# Patient Record
Sex: Female | Born: 1975 | Hispanic: Yes | Marital: Married | Smoking: Never smoker
Health system: Southern US, Community
[De-identification: ages and names within clinical notes are randomized; demographics above are authoritative.]

## PROBLEM LIST (undated history)

## (undated) DIAGNOSIS — I10 Essential (primary) hypertension: Secondary | ICD-10-CM

---

## 2000-08-21 ENCOUNTER — Other Ambulatory Visit: Admission: RE | Admit: 2000-08-21 | Discharge: 2000-08-21 | Payer: Self-pay | Admitting: *Deleted

## 2000-08-23 ENCOUNTER — Ambulatory Visit (HOSPITAL_COMMUNITY): Admission: RE | Admit: 2000-08-23 | Discharge: 2000-08-23 | Payer: Self-pay | Admitting: *Deleted

## 2000-08-23 ENCOUNTER — Encounter: Payer: Self-pay | Admitting: *Deleted

## 2001-03-04 ENCOUNTER — Inpatient Hospital Stay (HOSPITAL_COMMUNITY): Admission: AD | Admit: 2001-03-04 | Discharge: 2001-03-04 | Payer: Self-pay | Admitting: Obstetrics

## 2001-03-04 ENCOUNTER — Encounter: Payer: Self-pay | Admitting: Obstetrics

## 2001-03-27 ENCOUNTER — Inpatient Hospital Stay (HOSPITAL_COMMUNITY): Admission: AD | Admit: 2001-03-27 | Discharge: 2001-03-29 | Payer: Self-pay | Admitting: Obstetrics

## 2002-05-21 ENCOUNTER — Other Ambulatory Visit: Admission: RE | Admit: 2002-05-21 | Discharge: 2002-05-21 | Payer: Self-pay | Admitting: *Deleted

## 2004-02-29 ENCOUNTER — Other Ambulatory Visit: Admission: RE | Admit: 2004-02-29 | Discharge: 2004-02-29 | Payer: Self-pay | Admitting: *Deleted

## 2005-12-26 ENCOUNTER — Encounter: Admission: RE | Admit: 2005-12-26 | Discharge: 2005-12-26 | Payer: Self-pay | Admitting: *Deleted

## 2012-02-06 ENCOUNTER — Other Ambulatory Visit: Payer: Self-pay | Admitting: Physician Assistant

## 2012-02-12 ENCOUNTER — Other Ambulatory Visit: Payer: Self-pay | Admitting: Physician Assistant

## 2012-05-03 ENCOUNTER — Other Ambulatory Visit: Payer: Self-pay | Admitting: Physician Assistant

## 2012-08-11 ENCOUNTER — Other Ambulatory Visit: Payer: Self-pay | Admitting: Physician Assistant

## 2012-08-12 NOTE — Telephone Encounter (Signed)
Patients chart is at the nurses station in the pa pool pile.  [facility] [id number]

## 2014-08-01 ENCOUNTER — Emergency Department (HOSPITAL_COMMUNITY): Payer: Self-pay

## 2014-08-01 ENCOUNTER — Emergency Department (HOSPITAL_COMMUNITY)
Admission: EM | Admit: 2014-08-01 | Discharge: 2014-08-01 | Disposition: A | Discharge status: Home / Self Care | Payer: Self-pay | Attending: Emergency Medicine | Admitting: Emergency Medicine

## 2014-08-01 ENCOUNTER — Encounter (HOSPITAL_COMMUNITY): Payer: Self-pay | Admitting: Emergency Medicine

## 2014-08-01 DIAGNOSIS — K2970 Gastritis, unspecified, without bleeding: Secondary | ICD-10-CM | POA: Insufficient documentation

## 2014-08-01 DIAGNOSIS — R0789 Other chest pain: Secondary | ICD-10-CM | POA: Insufficient documentation

## 2014-08-01 DIAGNOSIS — Z79899 Other long term (current) drug therapy: Secondary | ICD-10-CM | POA: Insufficient documentation

## 2014-08-01 DIAGNOSIS — I10 Essential (primary) hypertension: Secondary | ICD-10-CM | POA: Insufficient documentation

## 2014-08-01 DIAGNOSIS — Z7982 Long term (current) use of aspirin: Secondary | ICD-10-CM | POA: Insufficient documentation

## 2014-08-01 DIAGNOSIS — F419 Anxiety disorder, unspecified: Secondary | ICD-10-CM | POA: Insufficient documentation

## 2014-08-01 DIAGNOSIS — R0602 Shortness of breath: Secondary | ICD-10-CM | POA: Insufficient documentation

## 2014-08-01 HISTORY — DX: Essential (primary) hypertension: I10

## 2014-08-01 LAB — CBC WITH DIFFERENTIAL/PLATELET
Basophils Absolute: 0 10*3/uL (ref 0.0–0.1)
Basophils Relative: 0 % (ref 0–1)
EOS ABS: 0.1 10*3/uL (ref 0.0–0.7)
EOS PCT: 1 % (ref 0–5)
HCT: 37.5 % (ref 36.0–46.0)
HEMOGLOBIN: 13.1 g/dL (ref 12.0–15.0)
LYMPHS ABS: 3 10*3/uL (ref 0.7–4.0)
Lymphocytes Relative: 27 % (ref 12–46)
MCH: 30.8 pg (ref 26.0–34.0)
MCHC: 34.9 g/dL (ref 30.0–36.0)
MCV: 88 fL (ref 78.0–100.0)
MONOS PCT: 6 % (ref 3–12)
Monocytes Absolute: 0.7 10*3/uL (ref 0.1–1.0)
Neutro Abs: 7.5 10*3/uL (ref 1.7–7.7)
Neutrophils Relative %: 66 % (ref 43–77)
Platelets: 295 10*3/uL (ref 150–400)
RBC: 4.26 MIL/uL (ref 3.87–5.11)
RDW: 12.8 % (ref 11.5–15.5)
WBC: 11.3 10*3/uL — ABNORMAL HIGH (ref 4.0–10.5)

## 2014-08-01 LAB — I-STAT CHEM 8, ED
BUN: 7 mg/dL (ref 6–23)
CREATININE: 0.6 mg/dL (ref 0.50–1.10)
Calcium, Ion: 1.21 mmol/L (ref 1.12–1.23)
Chloride: 107 mEq/L (ref 96–112)
GLUCOSE: 83 mg/dL (ref 70–99)
HCT: 41 % (ref 36.0–46.0)
HEMOGLOBIN: 13.9 g/dL (ref 12.0–15.0)
Potassium: 3.7 mEq/L (ref 3.7–5.3)
Sodium: 141 mEq/L (ref 137–147)
TCO2: 21 mmol/L (ref 0–100)

## 2014-08-01 LAB — I-STAT TROPONIN, ED: Troponin i, poc: 0 ng/mL (ref 0.00–0.08)

## 2014-08-01 MED ORDER — RANITIDINE HCL 150 MG PO TABS: 150.0000 mg | ORAL_TABLET | Freq: Two times a day (BID) | ORAL | Status: AC

## 2014-08-01 NOTE — ED Notes (Signed)
Returned from xray

## 2014-08-01 NOTE — Discharge Instructions (Signed)

## 2014-08-01 NOTE — ED Provider Notes (Signed)
CSN: [id number]     Arrival date & time 08/01/14  1111 History   First MD Initiated Contact with Patient 08/01/14 1206     Chief Complaint  Patient presents with  . Chest Pain     (Consider location/radiation/quality/duration/timing/severity/associated sxs/prior Treatment) Patient is a 38 y.o. female presenting with chest pain. The history is provided by the patient and a relative. The history is limited by a language barrier. A language interpreter was used.  Chest Pain Pain location:  Substernal area and epigastric Pain quality: burning   Pain quality comment:  Squeezing Pain radiates to:  Does not radiate Pain radiates to the back: no   Pain severity:  Moderate Onset quality:  Gradual Duration:  1 hour Timing:  Constant Progression:  Resolved Chronicity:  New Context: at rest and stress   Context comment:  Was sitting on the cough when she began feeling SOB and then started to have squeezing in her chest Relieved by:  Aspirin and nitroglycerin Worsened by:  Nothing tried Ineffective treatments:  None tried Associated symptoms: abdominal pain and shortness of breath   Associated symptoms: no cough, no diaphoresis, no lower extremity edema, no nausea, not vomiting and no weakness   Associated symptoms comment:  For months now her stomach has felt like it was in knots due to increased stress with her family Risk factors: birth control and hypertension   Risk factors: no coronary artery disease, no diabetes mellitus, no immobilization, not obese, not pregnant, no prior DVT/PE, no smoking and no surgery     Past Medical History  Diagnosis Date  . Hypertension    History reviewed. No pertinent past surgical history. History reviewed. No pertinent family history. History  Substance Use Topics  . Smoking status: Never Smoker   . Smokeless tobacco: Never Used  . Alcohol Use: No   OB History   Grav Para Term Preterm Abortions TAB SAB Ect Mult Living                 Review  of Systems  Constitutional: Negative for diaphoresis.  Respiratory: Positive for shortness of breath. Negative for cough.   Cardiovascular: Positive for chest pain.  Gastrointestinal: Positive for abdominal pain. Negative for nausea and vomiting.  Neurological: Negative for weakness.  All other systems reviewed and are negative.     Allergies  Review of patient's allergies indicates no known allergies.  Home Medications   Prior to Admission medications   Medication Sig Start Date End Date Taking? Authorizing Provider  aspirin 325 MG tablet Take 325 mg by mouth once.   Yes Historical Provider, MD  lisinopril-hydrochlorothiazide (PRINZIDE,ZESTORETIC) 10-12.5 MG per tablet Take 1 tablet by mouth daily.   Yes Historical Provider, MD   BP 109/66  Pulse 80  Temp(Src) 98.3 F (36.8 C) (Oral)  Resp 14  SpO2 99%  LMP 07/11/2014 Physical Exam  Nursing note and vitals reviewed. Constitutional: She is oriented to person, place, and time. She appears well-developed and well-nourished. No distress.  HENT:  Head: Normocephalic and atraumatic.  Mouth/Throat: Oropharynx is clear and moist.  Eyes: Conjunctivae and EOM are normal. Pupils are equal, round, and reactive to light.  Neck: Normal range of motion. Neck supple.  Cardiovascular: Normal rate, regular rhythm and intact distal pulses.   No murmur heard. Pulmonary/Chest: Effort normal and breath sounds normal. No respiratory distress. She has no wheezes. She has no rales. She exhibits no tenderness.  Abdominal: Soft. She exhibits no distension. There is no tenderness. There  is no rebound and no guarding.  Musculoskeletal: Normal range of motion. She exhibits no edema and no tenderness.  Neurological: She is alert and oriented to person, place, and time.  Skin: Skin is warm and dry. No rash noted. No erythema.  Psychiatric: She has a normal mood and affect. Her behavior is normal.    ED Course  Procedures (including critical care  time) Labs Review Labs Reviewed  CBC WITH DIFFERENTIAL - Abnormal; Notable for the following:    WBC 11.3 (*)    All other components within normal limits  I-STAT CHEM 8, ED  I-STAT TROPOININ, ED    Imaging Review Dg Chest 2 View  08/01/2014   CLINICAL DATA:  chestpain  EXAM: CHEST - 2 VIEW  COMPARISON:  None available  FINDINGS: Lungs are clear. Heart size and mediastinal contours are within normal limits. No effusion. Visualized skeletal structures are unremarkable.  IMPRESSION: No acute cardiopulmonary disease.   Electronically Signed   By: Oley Balm M.D.   On: 08/01/2014 15:03     EKG Interpretation   Date/Time:  Saturday August 01 2014 11:18:36 EDT Ventricular Rate:  81 PR Interval:  148 QRS Duration: 114 QT Interval:  377 QTC Calculation: 438 R Axis:   -3 Text Interpretation:  Sinus rhythm Incomplete right bundle branch block  Low voltage, precordial leads No previous tracing Confirmed by Anitra Lauth   MD, Alphonzo Lemmings ([id number]) on 08/01/2014 12:18:55 PM      MDM   Final diagnoses:  Atypical chest pain  Gastritis    Patient presenting with atypical chest pain that started while she was sitting on her couch. It was a squeezing pain that started after a sensation of feeling short of breath. Patient took an aspirin with some pain relief and EMS gave her one nitroglycerin with resolution of pain. Patient's only cardiac risk factor is hypertension. She denies any prior history of chest pain. However she states in the last few months she's had increased stress and anxiousness.  She has ongoing stomach issues where she feels like her stomach is in knots and is constantly nervous and upset. She denies taking any over-the-counter stomach medication and takes nothing current depression or anxiety at this time. EKG shows an incomplete right bundle branch block but is otherwise within normal limits with none to compare. Patient denies any chest pain at this time however does have some  mild epigastric pain on exam. She is low risk for PE without symptoms suggestive of PE. Patient also states she was diagnosed with a urinary tract infection yesterday by her PCP and was given an antibiotic but has not started it yet. She currently denies any urinary symptoms and her last menstrual period was 3 weeks ago.  Low Suspicion for cardiac chest pain at this time. EKG without acute findings. Heart Score of 1. Chest x-ray, CBC, BMP, i-STAT troponin pending. By the time patient arrived here from the onset of her pain it has been every 3 hours. The initial troponin is negative given her low risk feel that one troponin will be sufficient. Feel most likely patient's symptoms are related to GI upset and stress and anxiety.  2:00 PM All labs wnl.  CXR pending. 9:07 AM Normal CXR.  Chest pain has not returned and pt feels much better.  Feel GI and anxiety related started PPI and d/ced home.  Gwyneth Sprout, MD 08/02/14 [id number]

## 2014-08-01 NOTE — ED Notes (Signed)
[facility] presents with a 38 yo female from home with chest pain that occurred approximately at 10 am this morning while watching TV.  Pt had similar episode approximately 2 weeks ago; however, pt took ASA and relieved pain and did not seek medical attention.  Pt had left CP rated 5 out of 10 and squeezing.  Took 325 ASA and relieved pain some; [facility] gave 1 NTG and pain relieved.  Hx of HTN and took BP meds (lisinopril) at 8 am this morning.  NSR. VS stable. No N/V and no diaphoresis.

## 2015-12-14 ENCOUNTER — Emergency Department (HOSPITAL_COMMUNITY)
Admission: EM | Admit: 2015-12-14 | Discharge: 2015-12-14 | Disposition: A | Discharge status: Home / Self Care | Payer: BLUE CROSS/BLUE SHIELD | Attending: Emergency Medicine | Admitting: Emergency Medicine

## 2015-12-14 ENCOUNTER — Emergency Department (HOSPITAL_COMMUNITY): Payer: BLUE CROSS/BLUE SHIELD

## 2015-12-14 ENCOUNTER — Encounter (HOSPITAL_COMMUNITY): Payer: Self-pay

## 2015-12-14 DIAGNOSIS — Z23 Encounter for immunization: Secondary | ICD-10-CM

## 2015-12-14 DIAGNOSIS — R21 Rash and other nonspecific skin eruption: Secondary | ICD-10-CM | POA: Diagnosis present

## 2015-12-14 DIAGNOSIS — Z203 Contact with and (suspected) exposure to rabies: Secondary | ICD-10-CM | POA: Diagnosis not present

## 2015-12-14 DIAGNOSIS — Z7982 Long term (current) use of aspirin: Secondary | ICD-10-CM | POA: Insufficient documentation

## 2015-12-14 DIAGNOSIS — I10 Essential (primary) hypertension: Secondary | ICD-10-CM | POA: Insufficient documentation

## 2015-12-14 DIAGNOSIS — Z3202 Encounter for pregnancy test, result negative: Secondary | ICD-10-CM | POA: Insufficient documentation

## 2015-12-14 DIAGNOSIS — Z79899 Other long term (current) drug therapy: Secondary | ICD-10-CM | POA: Diagnosis not present

## 2015-12-14 LAB — COMPREHENSIVE METABOLIC PANEL
ALBUMIN: 3.5 g/dL (ref 3.5–5.0)
ALT: 32 U/L (ref 14–54)
AST: 32 U/L (ref 15–41)
Alkaline Phosphatase: 83 U/L (ref 38–126)
Anion gap: 12 (ref 5–15)
BILIRUBIN TOTAL: 0.2 mg/dL — AB (ref 0.3–1.2)
BUN: 7 mg/dL (ref 6–20)
CALCIUM: 9.5 mg/dL (ref 8.9–10.3)
CO2: 22 mmol/L (ref 22–32)
Chloride: 106 mmol/L (ref 101–111)
Creatinine, Ser: 0.59 mg/dL (ref 0.44–1.00)
GFR calc Af Amer: >60 mL/min (ref >60)
GFR calc non Af Amer: >60 mL/min (ref >60)
GLUCOSE: 118 mg/dL — AB (ref 65–99)
Potassium: 3.7 mmol/L (ref 3.5–5.1)
Sodium: 140 mmol/L (ref 135–145)
TOTAL PROTEIN: 7.2 g/dL (ref 6.5–8.1)

## 2015-12-14 LAB — CBC WITH DIFFERENTIAL/PLATELET
BASOS ABS: 0 10*3/uL (ref 0.0–0.1)
BASOS PCT: 0 %
Eosinophils Absolute: 0.2 10*3/uL (ref 0.0–0.7)
Eosinophils Relative: 2 %
HCT: 39.6 % (ref 36.0–46.0)
Hemoglobin: 13.5 g/dL (ref 12.0–15.0)
LYMPHS PCT: 36 %
Lymphs Abs: 3.4 10*3/uL (ref 0.7–4.0)
MCH: 30.9 pg (ref 26.0–34.0)
MCHC: 34.1 g/dL (ref 30.0–36.0)
MCV: 90.6 fL (ref 78.0–100.0)
Monocytes Absolute: 0.7 10*3/uL (ref 0.1–1.0)
Monocytes Relative: 7 %
NEUTROS PCT: 55 %
Neutro Abs: 5.3 10*3/uL (ref 1.7–7.7)
Platelets: 262 10*3/uL (ref 150–400)
RBC: 4.37 MIL/uL (ref 3.87–5.11)
RDW: 12.5 % (ref 11.5–15.5)
WBC: 9.6 10*3/uL (ref 4.0–10.5)

## 2015-12-14 LAB — I-STAT BETA HCG BLOOD, ED (MC, WL, AP ONLY): I-stat hCG, quantitative: <5 m[IU]/mL (ref <5)

## 2015-12-14 MED ORDER — RABIES IMMUNE GLOBULIN 150 UNIT/ML IM INJ
20.0000 [IU]/kg | INJECTION | Freq: Once | INTRAMUSCULAR | Status: AC
  Administered 2015-12-14: 1725 [IU] via INTRAMUSCULAR
  Filled 2015-12-14: qty 11.5

## 2015-12-14 MED ORDER — RABIES VACCINE, PCEC IM SUSR
1.0000 mL | Freq: Once | INTRAMUSCULAR | Status: AC
  Administered 2015-12-14: 1 mL via INTRAMUSCULAR
  Filled 2015-12-14: qty 1

## 2015-12-14 NOTE — Discharge Instructions (Signed)
°  Por favor, siga con su mdico de atencin primaria en los prximos 2 das para un chequeo. Deben obtener registros para su posterior manejo.  No dude en volver al [facility] para cualquier nuevo, empeoramiento o sntomas relacionados.   Usted necesitar inyecciones de refuerzo para asegurarse de que est protegido contra la rabia. El calendario para las inyecciones de refuerzo es el siguiente:  [place] 3: Franco Nones 12/17/2015 Para: Shela Nevin  DA 7: Fecha 12/21/2015 Para: [facility]  DIA 14: Fecha 2/28/207 A: Shela Nevin

## 2015-12-14 NOTE — ED Notes (Signed)
Pt here for c/o rash after visiting [place] last week. She reports being in a cave and PCP sent her here to be evaluated. Redness noted to chest, back, and arms. No distress.

## 2015-12-14 NOTE — ED Provider Notes (Signed)
CSN: [id number]     Arrival date & time 12/14/15  1718 History  By signing my name below, I, Melanie Salinas, attest that this documentation has been prepared under the direction and in the presence of [employer], PA-C. Electronically Signed: Randell Patient, ED Scribe. 12/14/2015. 8:36 PM.     Chief Complaint  Patient presents with  . Rash   The history is provided by the patient. No language interpreter was used.   HPI Comments: Melanie Salinas is a 40 y.o. female with an hx of HTN who presents to the Emergency Department complaining of constant, pruritic, burning rash that appeared on her upper chest 6 days ago upon waking and has since spread to her back and BUE. Son reports that the patient was exiting a cave with bats in [place], [place] 1 week ago when she felt something fly around her and touch her that she immediately attempted to swat away. She endorses cough and fatigue. She has taken Zantac without relief. Per patient, she was seen by her PCP and prescribed a hydrocortisone cream which she has not applied. Patient denies being vaccinated for rabies. She denies nausea and vomiting.  Past Medical History  Diagnosis Date  . Hypertension    History reviewed. No pertinent past surgical history. History reviewed. No pertinent family history. Social History  Substance Use Topics  . Smoking status: Never Smoker   . Smokeless tobacco: Never Used  . Alcohol Use: No   OB History    No data available     Review of Systems A complete 10 system review of systems was obtained and all systems are negative except as noted in the HPI and PMH.    Allergies  Review of patient's allergies indicates no known allergies.  Home Medications   Prior to Admission medications   Medication Sig Start Date End Date Taking? Authorizing Provider  aspirin 325 MG tablet Take 325 mg by mouth once.    Historical Provider, MD  lisinopril-hydrochlorothiazide (PRINZIDE,ZESTORETIC)  10-12.5 MG per tablet Take 1 tablet by mouth daily.    Historical Provider, MD  ranitidine (ZANTAC) 150 MG tablet Take 1 tablet (150 mg total) by mouth 2 (two) times daily. 08/01/14   Gwyneth Sprout, MD   BP 111/88 mmHg  Pulse 80  Temp(Src) 98.5 F (36.9 C) (Oral)  Resp 18  Ht  (1.626 m)  Wt 87.998 kg  BMI 33.28 kg/m2  SpO2 97%  LMP 12/07/2015 (Within Days) Physical Exam  Constitutional: She is oriented to person, place, and time. She appears well-developed and well-nourished. No distress.  HENT:  Head: Normocephalic and atraumatic.  Mouth/Throat: Oropharynx is clear and moist.  Eyes: Conjunctivae and EOM are normal. Pupils are equal, round, and reactive to light.  Neck: Normal range of motion.  Cardiovascular: Normal rate, regular rhythm and intact distal pulses.   Pulmonary/Chest: Effort normal and breath sounds normal. No stridor.  Abdominal: Soft. There is no tenderness.  Musculoskeletal: Normal range of motion.  Neurological: She is alert and oriented to person, place, and time.  Skin: Rash noted. She is not diaphoretic.  Macular areas of erythema to upper chest. Lesions are blanchable, the spare the palms soles and mucous membranes, no warmth, discharge or tenderness to palpation.  Psychiatric: She has a normal mood and affect.  Nursing note and vitals reviewed.     ED Course  Procedures   DIAGNOSTIC STUDIES: Oxygen Saturation is 100% on RA, normal by my interpretation.    COORDINATION OF CARE:  6:02 PM Will consult with attending MD. Will order medications, labs, and chest x-ray. Discussed treatment plan with pt at bedside and pt agreed to plan.  6:51 PM Returned to discuss treatment plan. Will administer Rabavert and Hyperab.  Labs Review Labs Reviewed  COMPREHENSIVE METABOLIC PANEL - Abnormal; Notable for the following:    Glucose, Bld 118 (*)    Total Bilirubin 0.2 (*)    All other components within normal limits  CBC WITH DIFFERENTIAL/PLATELET   I-STAT BETA HCG BLOOD, ED ([facility], [facility], [facility] ONLY)    Imaging Review Dg Chest 2 View  12/14/2015  CLINICAL DATA:  Cough and sore throat for 1 week EXAM: CHEST  2 VIEW COMPARISON:  August 01, 2014 FINDINGS: The heart size and mediastinal contours are within normal limits. Small calcified granuloma is identified in the right lung base unchanged. There is no focal infiltrate, pulmonary edema, or pleural effusion. The lung volumes are low. The visualized skeletal structures are unremarkable. IMPRESSION: No active cardiopulmonary disease. Electronically Signed   By: Sherian Rein M.D.   On: 12/14/2015 18:35   I have personally reviewed and evaluated these images and lab results as part of my medical decision-making.   EKG Interpretation None      MDM   Final diagnoses:  Need for rabies vaccination  Rash and nonspecific skin eruption    Filed Vitals:   12/14/15 1728 12/14/15 2019  BP: 136/74 111/88  Pulse: 90 80  Temp: 98.5 F (36.9 C) 98.5 F (36.9 C)  TempSrc: Oral Oral  Resp: 16 18  Height:  (1.626 m)   Weight: 87.998 kg   SpO2: 100% 97%    Medications  rabies vaccine (RABAVERT) injection 1 mL (1 mL Intramuscular Given 12/14/15 1904)  rabies immune globulin (HYPERAB) injection 1,725 Units (1,725 Units Intramuscular Given 12/14/15 1852)    [name] is 40 y.o. female presenting with pruritic and burning rash to chest, patient is afebrile, well-appearing, rash appears similar to a sunburn, does not affect the mucous membranes palms or soles. Patient was recently in [place], she was indicated and felt a fluttering around her face, she may have been exposed to that. I think rabies vaccination is indicated. I do not believe the exposure and the [facility] is connected to the rash. Patient is given prescription for hydrocortisone from her primary care physician for the rash. I will check basic blood work, chest x-ray (seizures reporting cough)  Blood work and chest x-ray  reassuring. Patient is counseled on how to continue the rabies vaccination series.  Discussed case with attending physician who agrees with care plan and disposition.   Evaluation does not show pathology that would require ongoing emergent intervention or inpatient treatment. Pt is hemodynamically stable and mentating appropriately. Discussed findings and plan with patient/guardian, who agrees with care plan. All questions answered. Return precautions discussed and outpatient follow up given.   I personally performed the services described in this documentation, which was scribed in my presence. The recorded information has been reviewed and is accurate.   Wynetta Emery, PA-C 12/14/15 2036  Loren Racer, MD 12/15/15 [phone]

## 2015-12-17 ENCOUNTER — Emergency Department (INDEPENDENT_AMBULATORY_CARE_PROVIDER_SITE_OTHER)
Admission: EM | Admit: 2015-12-17 | Discharge: 2015-12-17 | Disposition: A | Discharge status: Home / Self Care | Payer: BLUE CROSS/BLUE SHIELD | Source: Home / Self Care

## 2015-12-17 ENCOUNTER — Encounter (HOSPITAL_COMMUNITY): Payer: Self-pay | Admitting: Emergency Medicine

## 2015-12-17 DIAGNOSIS — Z203 Contact with and (suspected) exposure to rabies: Secondary | ICD-10-CM

## 2015-12-17 MED ORDER — RABIES VACCINE, PCEC IM SUSR
INTRAMUSCULAR | Status: AC
  Filled 2015-12-17: qty 1

## 2015-12-17 MED ORDER — RABIES VACCINE, PCEC IM SUSR
1.0000 mL | Freq: Once | INTRAMUSCULAR | Status: AC
  Administered 2015-12-17: 1 mL via INTRAMUSCULAR

## 2015-12-17 NOTE — Discharge Instructions (Signed)
Return as scheduled, sooner if any problem

## 2015-12-17 NOTE — ED Notes (Signed)
Patient seen in emergency department on 2/14.  Rabies series started at that time.  This is day #3 in rabies series.

## 2015-12-21 ENCOUNTER — Emergency Department (INDEPENDENT_AMBULATORY_CARE_PROVIDER_SITE_OTHER)
Admission: EM | Admit: 2015-12-21 | Discharge: 2015-12-21 | Disposition: A | Discharge status: Home / Self Care | Payer: BLUE CROSS/BLUE SHIELD | Source: Home / Self Care

## 2015-12-21 ENCOUNTER — Encounter (HOSPITAL_COMMUNITY): Payer: Self-pay | Admitting: Emergency Medicine

## 2015-12-21 DIAGNOSIS — Z203 Contact with and (suspected) exposure to rabies: Secondary | ICD-10-CM | POA: Diagnosis not present

## 2015-12-21 MED ORDER — RABIES VACCINE, PCEC IM SUSR
INTRAMUSCULAR | Status: AC
  Filled 2015-12-21: qty 1

## 2015-12-21 MED ORDER — RABIES VACCINE, PCEC IM SUSR
1.0000 mL | Freq: Once | INTRAMUSCULAR | Status: AC
  Administered 2015-12-21: 1 mL via INTRAMUSCULAR

## 2015-12-21 NOTE — ED Notes (Signed)
The patient presented to the [facility] with a complaint of needing her 3rd in the rabies vaccine series.

## 2015-12-28 ENCOUNTER — Encounter (HOSPITAL_COMMUNITY): Payer: Self-pay | Admitting: *Deleted

## 2015-12-28 ENCOUNTER — Emergency Department (INDEPENDENT_AMBULATORY_CARE_PROVIDER_SITE_OTHER)
Admission: EM | Admit: 2015-12-28 | Discharge: 2015-12-28 | Disposition: A | Discharge status: Home / Self Care | Payer: BLUE CROSS/BLUE SHIELD | Source: Home / Self Care

## 2015-12-28 DIAGNOSIS — Z203 Contact with and (suspected) exposure to rabies: Secondary | ICD-10-CM | POA: Diagnosis not present

## 2015-12-28 MED ORDER — RABIES VACCINE, PCEC IM SUSR
INTRAMUSCULAR | Status: AC
  Filled 2015-12-28: qty 1

## 2015-12-28 MED ORDER — RABIES VACCINE, PCEC IM SUSR
1.0000 mL | Freq: Once | INTRAMUSCULAR | Status: AC
  Administered 2015-12-28: 1 mL via INTRAMUSCULAR

## 2015-12-28 NOTE — Discharge Instructions (Signed)
Followup  As  Needed

## 2015-12-28 NOTE — ED Notes (Signed)
Pt presents   For  4 th  Rabies   Shot

## 2016-08-11 ENCOUNTER — Encounter (HOSPITAL_COMMUNITY): Payer: Self-pay

## 2016-08-11 ENCOUNTER — Emergency Department (HOSPITAL_COMMUNITY)
Admission: EM | Admit: 2016-08-11 | Discharge: 2016-08-11 | Disposition: A | Discharge status: Home / Self Care | Payer: BLUE CROSS/BLUE SHIELD | Attending: Emergency Medicine | Admitting: Emergency Medicine

## 2016-08-11 DIAGNOSIS — Z7982 Long term (current) use of aspirin: Secondary | ICD-10-CM | POA: Insufficient documentation

## 2016-08-11 DIAGNOSIS — Z79899 Other long term (current) drug therapy: Secondary | ICD-10-CM | POA: Diagnosis not present

## 2016-08-11 DIAGNOSIS — I10 Essential (primary) hypertension: Secondary | ICD-10-CM | POA: Insufficient documentation

## 2016-08-11 DIAGNOSIS — M62830 Muscle spasm of back: Secondary | ICD-10-CM | POA: Diagnosis not present

## 2016-08-11 DIAGNOSIS — M545 Low back pain: Secondary | ICD-10-CM | POA: Diagnosis present

## 2016-08-11 MED ORDER — NAPROXEN 500 MG PO TABS: 500.0000 mg | ORAL_TABLET | Freq: Two times a day (BID) | ORAL | 0 refills | Status: AC

## 2016-08-11 MED ORDER — METHOCARBAMOL 500 MG PO TABS: 1000.0000 mg | ORAL_TABLET | Freq: Four times a day (QID) | ORAL | 0 refills | Status: AC

## 2016-08-11 NOTE — Discharge Instructions (Signed)
Do not drive within 4 hours of taking robaxin as this will make you drowsy.  Avoid lifting,  Bending,  Twisting or any other activity that worsens your pain over the next week.  Continue using heat therapy to your lower back. You should get rechecked if your symptoms are not better over the next 5 days,  Or you develop increased pain,  Weakness in your leg(s) or loss of bladder or bowel function - these are symptoms of a worsened condition. Stop taking your flexeril while using robaxin.

## 2016-08-11 NOTE — ED Triage Notes (Signed)
Per Pt, Pt started to have lower back pain on Wednesday after she slept in the hospital with her family member. Pt reports pain in her left lower back. Pt saw the chiropractor yesterday and it felt better. This morning, pt woke up and it was worse.

## 2016-08-11 NOTE — ED Provider Notes (Signed)
[facility] DEPT Provider Note   CSN: [id number] Arrival date & time: 08/11/16  1220     History   Chief Complaint Chief Complaint  Patient presents with  . Back Pain    HPI Melanie Salinas is a 40 y.o. female presenting acute low back pain which has which has been present for the past 3 days.   Patient denies any new injury specifically but states she woke with pain Tuesday am after sleeping in her son's room who admitted for acute appendicitis. Her pain is constant but worse with movement and palpation.  She describes a squeezing, spasm like pain without radiation.  There has been no weakness or numbness in the lower extremities and no urinary or bowel retention or incontinence.  Patient does not have a history of cancer or IVDU.  The patient has tried heat and flexeril prescribed by her pcp yesterday without significant relief of symptoms.  She states she also saw a chiropractor yesterday and had a treatment, felt better until she woke with the same pain again today.    Back Pain   Pertinent negatives include no chest pain, no fever, no numbness, no abdominal pain, no dysuria and no weakness.    Past Medical History:  Diagnosis Date  . Hypertension     There are no active problems to display for this patient.   History reviewed. No pertinent surgical history.  OB History    No data available       Home Medications    Prior to Admission medications   Medication Sig Start Date End Date Taking? Authorizing Provider  aspirin 325 MG tablet Take 325 mg by mouth once.    Historical Provider, MD  lisinopril-hydrochlorothiazide (PRINZIDE,ZESTORETIC) 10-12.5 MG per tablet Take 1 tablet by mouth daily.    Historical Provider, MD  methocarbamol (ROBAXIN) 500 MG tablet Take 2 tablets (1,000 mg total) by mouth 4 (four) times daily. 08/11/16   Burgess Amor, PA-C  naproxen (NAPROSYN) 500 MG tablet Take 1 tablet (500 mg total) by mouth 2 (two) times daily. 08/11/16   Burgess Amor, PA-C  ranitidine (ZANTAC) 150 MG tablet Take 1 tablet (150 mg total) by mouth 2 (two) times daily. 08/01/14   Gwyneth Sprout, MD    Family History No family history on file.  Social History Social History  Substance Use Topics  . Smoking status: Never Smoker  . Smokeless tobacco: Never Used  . Alcohol use No     Allergies   Review of patient's allergies indicates no known allergies.   Review of Systems Review of Systems  Constitutional: Negative for fever.  Respiratory: Negative for shortness of breath.   Cardiovascular: Negative for chest pain and leg swelling.  Gastrointestinal: Negative for abdominal distention, abdominal pain and constipation.  Genitourinary: Negative for difficulty urinating, dysuria, flank pain, frequency and urgency.  Musculoskeletal: Positive for back pain. Negative for gait problem and joint swelling.  Skin: Negative for rash.  Neurological: Negative for weakness and numbness.     Physical Exam Updated Vital Signs BP 114/69 (BP Location: Right Arm)   Pulse 75   Temp 98.1 F (36.7 C) (Oral)   Resp 18   Ht 5\' 4"  (1.626 m)   Wt 88.9 kg   SpO2 97%   BMI 33.64 kg/m   Physical Exam  Constitutional: She appears well-developed and well-nourished.  HENT:  Head: Normocephalic.  Eyes: Conjunctivae are normal.  Neck: Normal range of motion. Neck supple.  Cardiovascular: Normal rate and intact  distal pulses.   Pedal pulses normal.  Pulmonary/Chest: Effort normal.  Abdominal: Soft. Bowel sounds are normal. She exhibits no distension and no mass.  Musculoskeletal: Normal range of motion. She exhibits no edema.       Lumbar back: She exhibits tenderness and spasm. She exhibits no swelling and no edema.       Back:  Neurological: She is alert. She has normal strength. She displays no atrophy and no tremor. No sensory deficit. Gait normal.  Reflex Scores:      Patellar reflexes are 2+ on the right side and 2+ on the left side.       Achilles reflexes are 2+ on the right side and 2+ on the left side. No strength deficit noted in hip and knee flexor and extensor muscle groups.  Ankle flexion and extension intact.  Skin: Skin is warm and dry.  Psychiatric: She has a normal mood and affect.  Nursing note and vitals reviewed.    ED Treatments / Results  Labs (all labs ordered are listed, but only abnormal results are displayed) Labs Reviewed - No data to display  EKG  EKG Interpretation None       Radiology No results found.  Procedures Procedures (including critical care time)  Medications Ordered in ED Medications - No data to display   Initial Impression / Assessment and Plan / ED Course  I have reviewed the triage vital signs and the nursing notes.  Pertinent labs & imaging results that were available during my care of the patient were reviewed by me and considered in my medical decision making (see chart for details).  Clinical Course    PT with low back strain/muscle spasm. She was encouraged activity as tolerated, continued heat tx,  Switched to robaxin in place of flexeril, naproxen.  Advised may take a 5-7 days before her sx are significantly improved.  Prn f/u with pcp if not improving.    No neuro deficit on exam or by history to suggest emergent or surgical presentation.  Discussed worsened sx that should prompt immediate re-evaluation including distal weakness, bowel/bladder retention/incontinence.      Final Clinical Impressions(s) / ED Diagnoses   Final diagnoses:  Muscle spasm of back    New Prescriptions New Prescriptions   METHOCARBAMOL (ROBAXIN) 500 MG TABLET    Take 2 tablets (1,000 mg total) by mouth 4 (four) times daily.   NAPROXEN (NAPROSYN) 500 MG TABLET    Take 1 tablet (500 mg total) by mouth 2 (two) times daily.     Burgess AmorJulie Raywood Wailes, PA-C 08/11/16 1713    Maia PlanJoshua G Long, MD 08/12/16 [phone]

## 2017-06-11 IMAGING — CR DG CHEST 2V
2 series · 3 of 3 positions shown · non-contrast
Comparison: August 01, 2014

CLINICAL DATA: Cough and sore throat for 1 week

EXAM:
CHEST  2 VIEW

[Series 1: chest pa · 0.14mm/px · 2 of 2 slices shown]
[im 1/2]
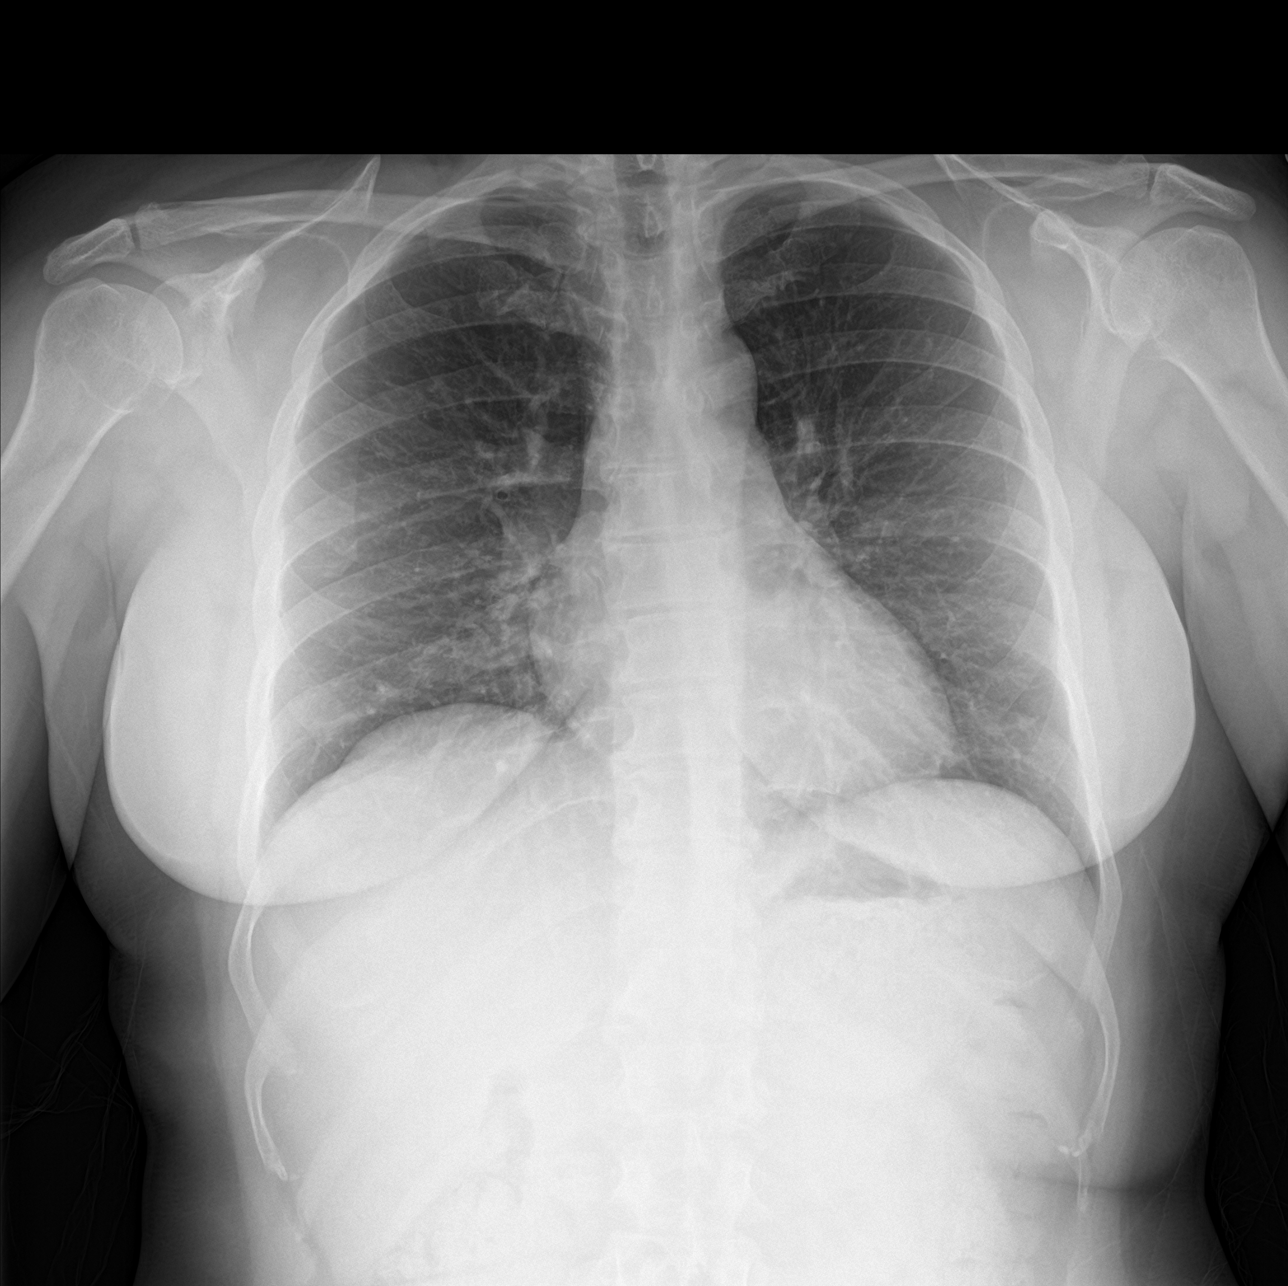
[im 2/2]
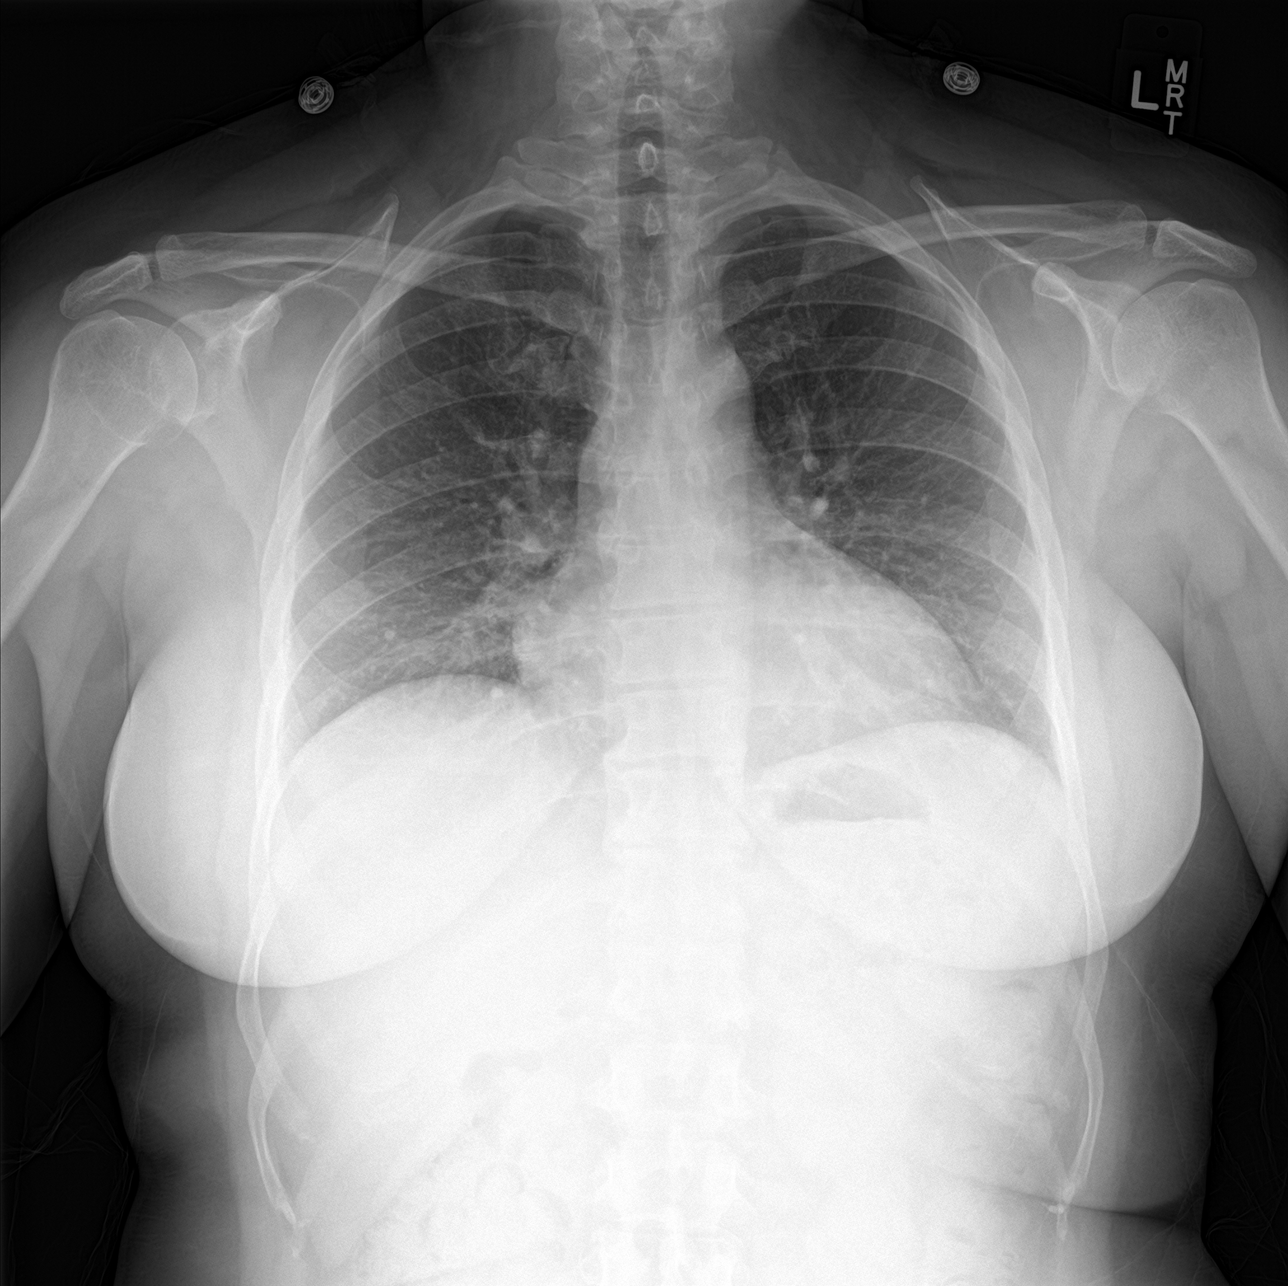

[chest lat]
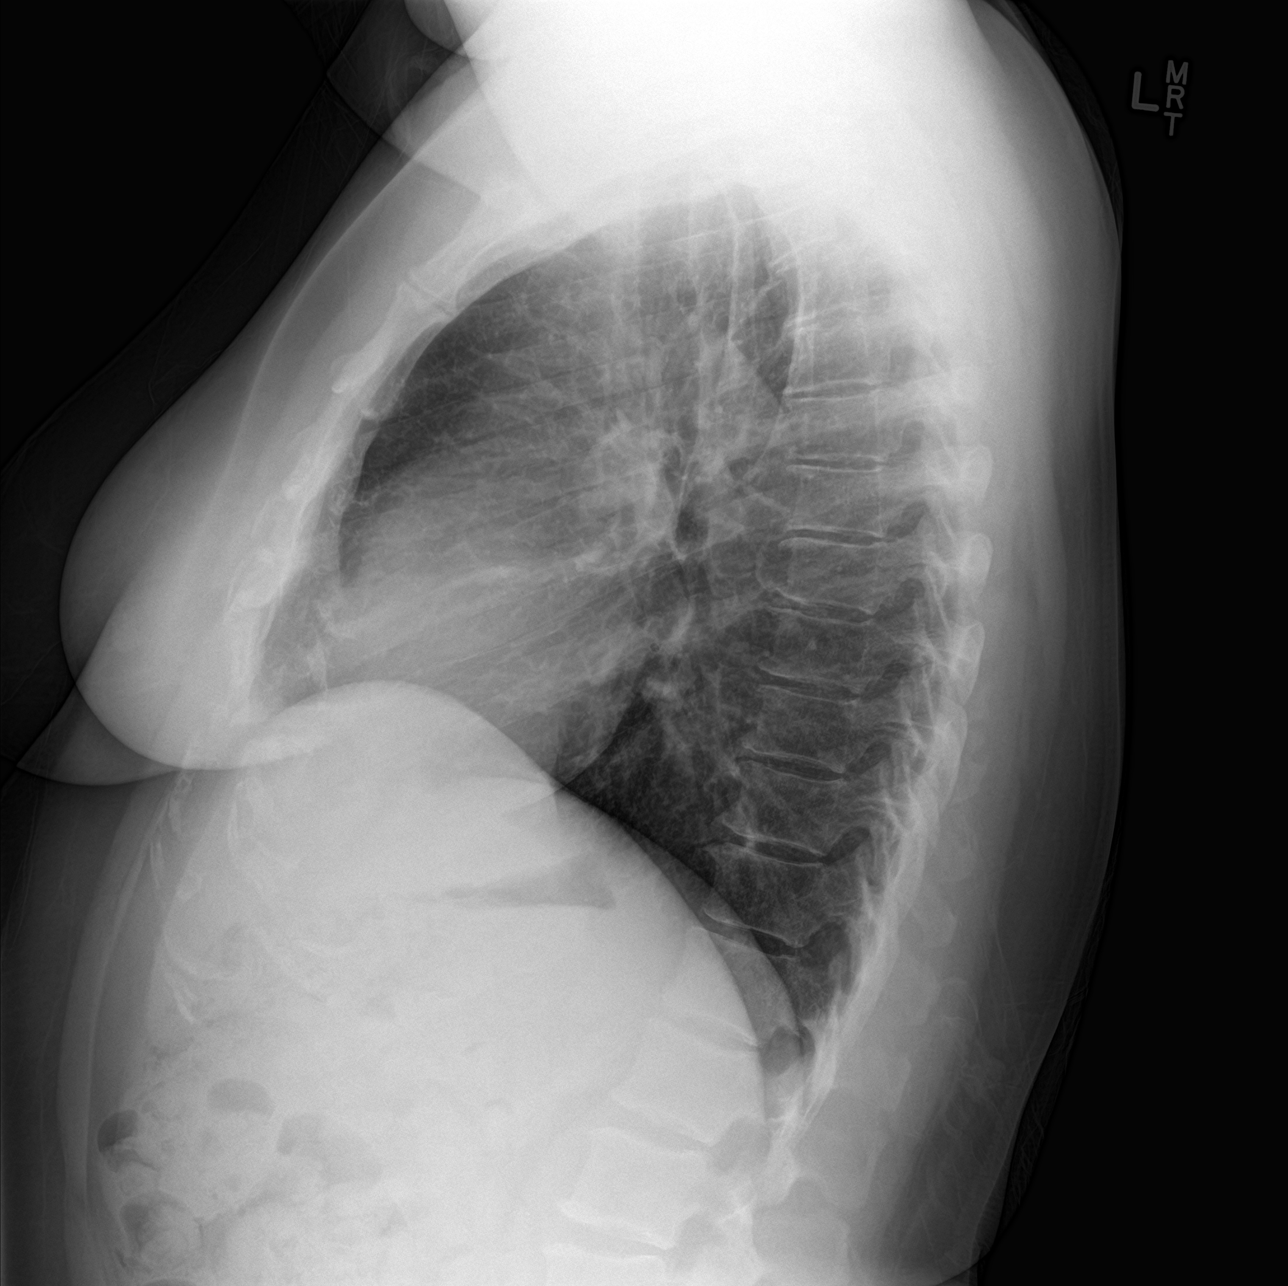

[3 of 3 positions shown; findings below may reference images not displayed]

FINDINGS: The heart size and mediastinal contours are within normal limits.
Small calcified granuloma is identified in the right lung base
unchanged. There is no focal infiltrate, pulmonary edema, or pleural
effusion. The lung volumes are low. The visualized skeletal
structures are unremarkable.
IMPRESSION: No active cardiopulmonary disease.

## 2024-08-25 ENCOUNTER — Encounter: Admitting: Obstetrics

## 2024-10-14 ENCOUNTER — Other Ambulatory Visit (HOSPITAL_COMMUNITY): Payer: Self-pay
